# Patient Record
Sex: Male | Born: 1985 | Race: Black or African American | Hispanic: No | Marital: Single | Smoking: Current every day smoker
Health system: Southern US, Community
[De-identification: ages and names within clinical notes are randomized; demographics above are authoritative.]

---
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
## 2008-03-11 ENCOUNTER — Emergency Department (HOSPITAL_COMMUNITY): Admission: EM | Admit: 2008-03-11 | Discharge: 2008-03-12 | Payer: Self-pay | Admitting: Emergency Medicine

## 2009-06-06 IMAGING — CT CT ABDOMEN W/O CM
2 of 4 series · 13 of 32 positions shown, 19 images · non-contrast
Comparison: None.

CT ABDOMEN

CLINICAL DATA: 21-year-old male with gross hematuria and abdominal
pain.

CT ABDOMEN AND PELVIS WITHOUT CONTRAST
TECHNIQUE: Multidetector CT imaging of the abdomen and pelvis was
performed following the standard
protocol without intravenous contrast.

[Series 2: <(id) w/o a/p >(id) · axial · non-contrast · 0.59mm/px · z∈[-398,-63]mm · 11 of 81 slices shown, 17 images]
[im 7/81  soft-tissue]
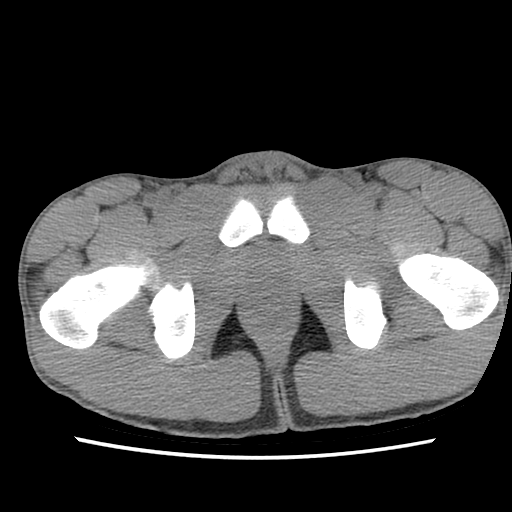
[im 7/81  bone]
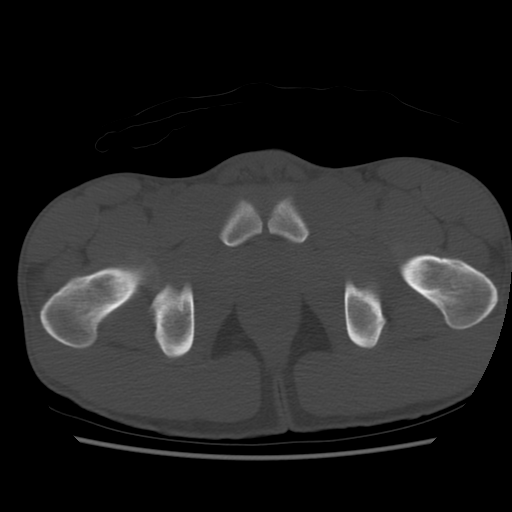
[im 14/81  soft-tissue]
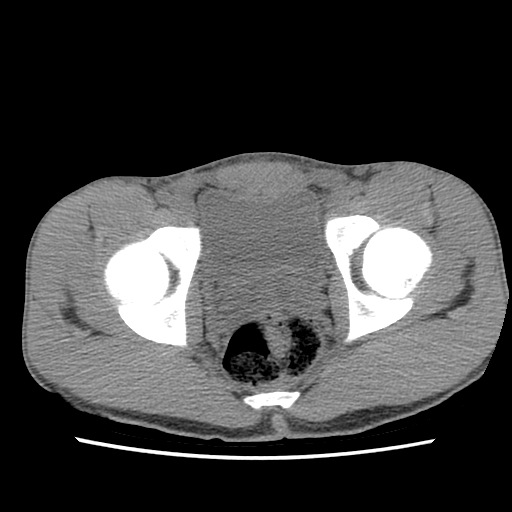
[im 21/81  soft-tissue]
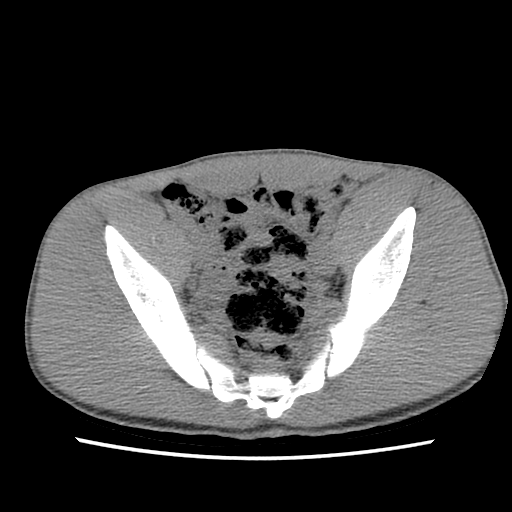
[im 27/81  soft-tissue]
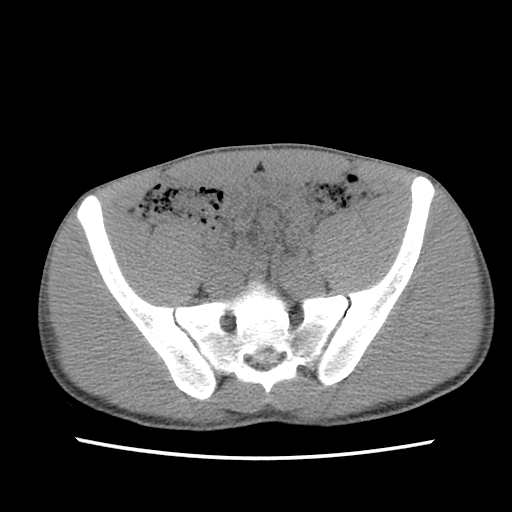
[im 34/81  soft-tissue]
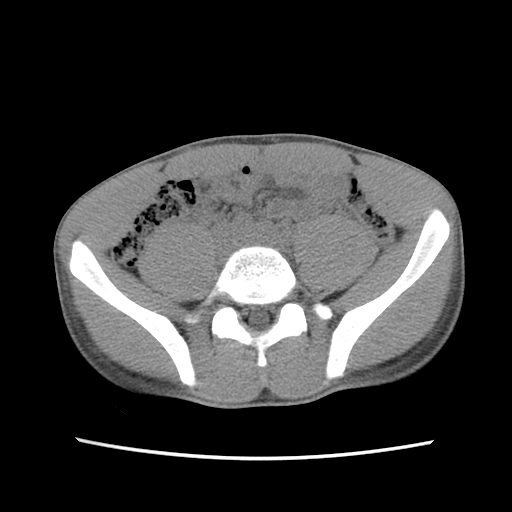
[im 41/81  soft-tissue]
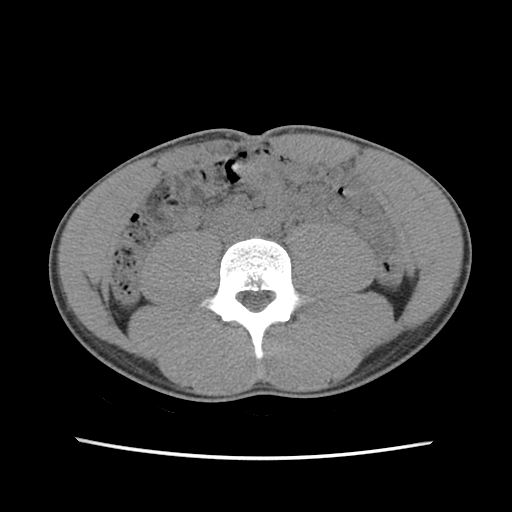
[im 47/81  soft-tissue]
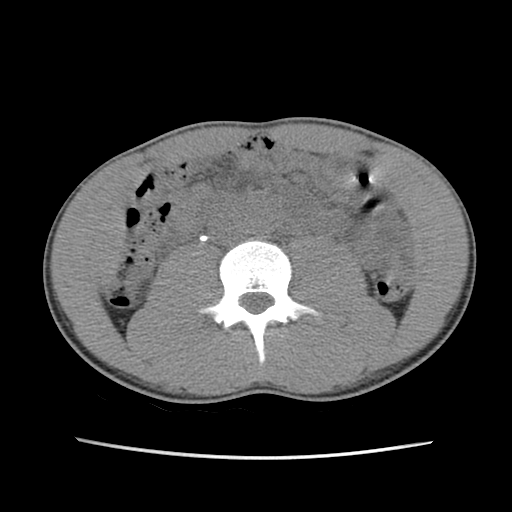
[im 54/81  soft-tissue]
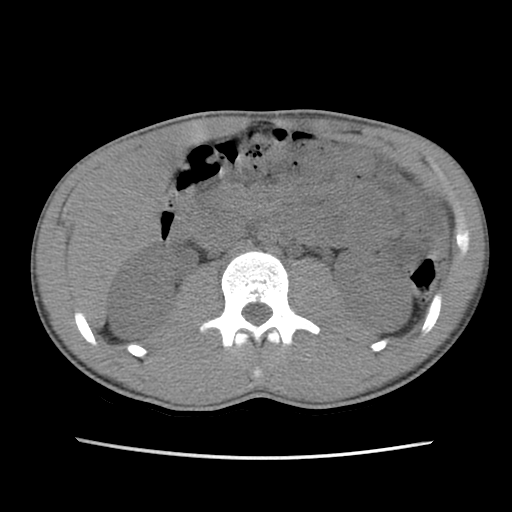
[im 54/81  lung]
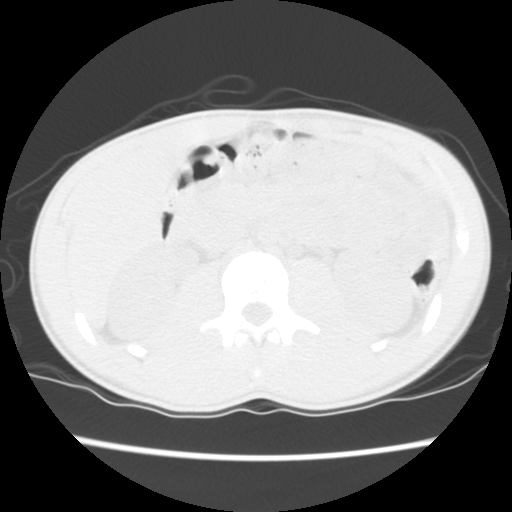
[im 61/81  soft-tissue]
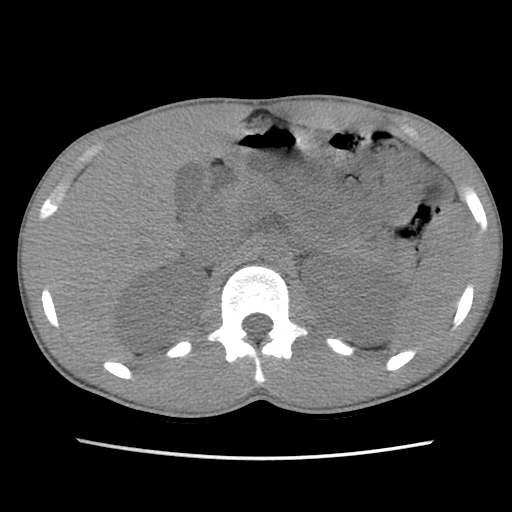
[im 61/81  lung]
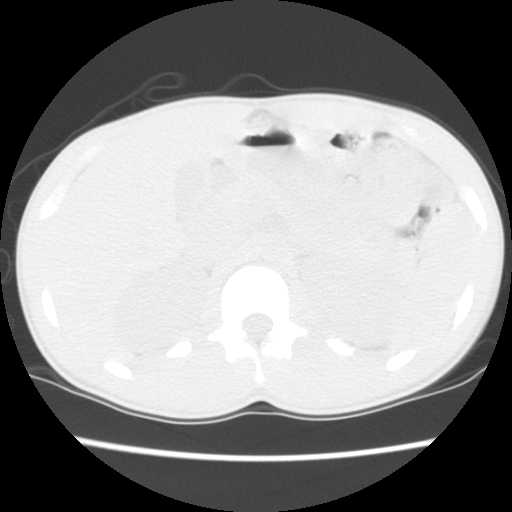
[im 61/81  bone]
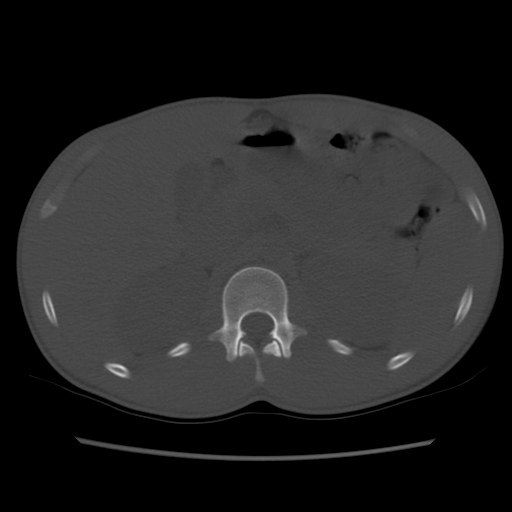
[im 67/81  soft-tissue]
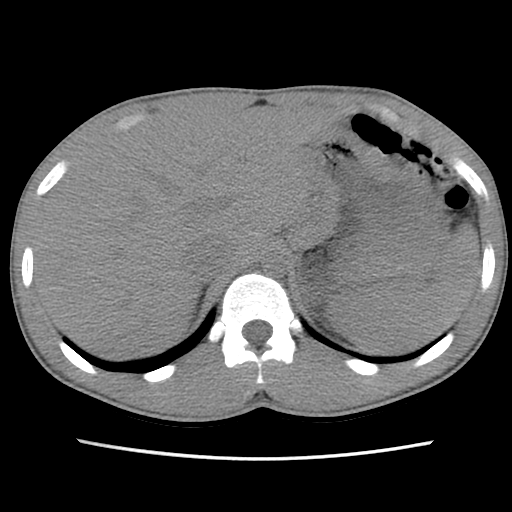
[im 67/81  lung]
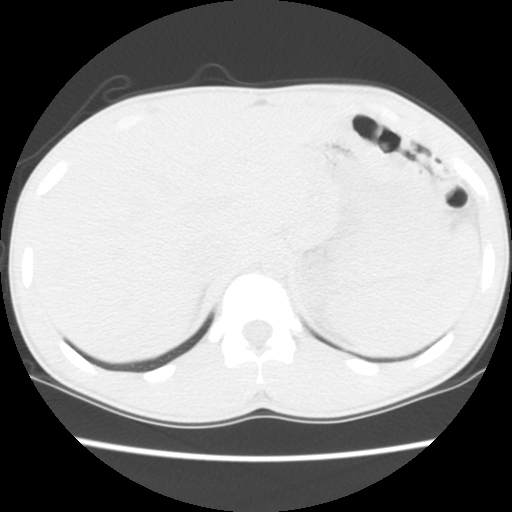
[im 74/81  soft-tissue]
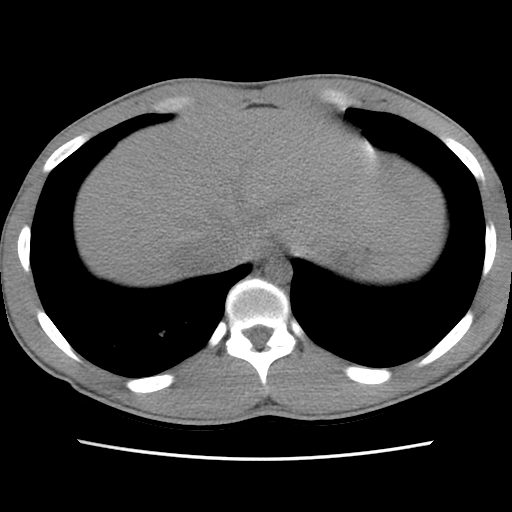
[im 74/81  lung]
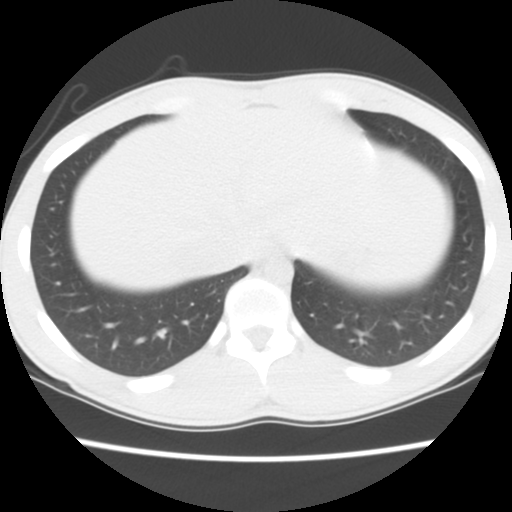

[Series 3: recon 2: <(id) w/o a/p >(id) · axial · non-contrast · 0.59mm/px · z∈[-118,-73]mm · 2 of 27 slices shown]
[im 9/27  soft-tissue]
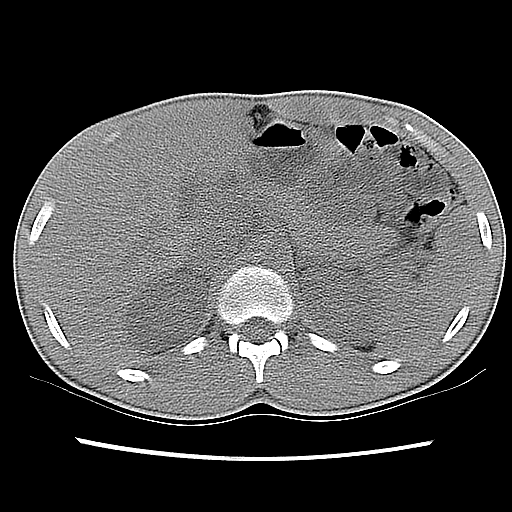
[im 18/27  soft-tissue]
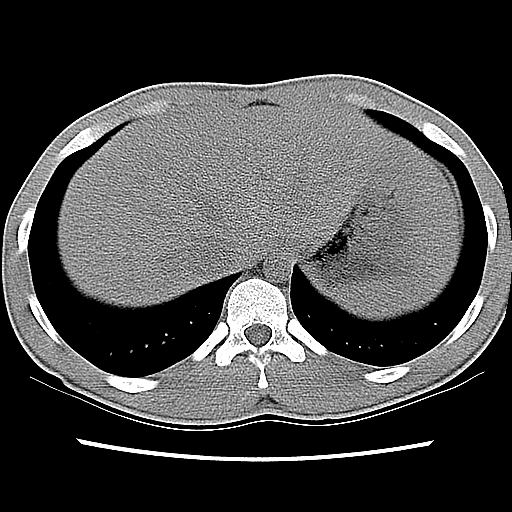

[13 of 32 positions shown; findings below may reference images not displayed]

FINDINGS: Visualized lung bases are clear.  The patient is then,
there is a paucity of intra-abdominal fat.  No osseous abnormality.
Visualized noncontrasted liver, gallbladder, spleen, pancreas,
adrenal glands, stomach and proximal small bowel loops are grossly
normal. Colon appears relatively decompressed with scant retained
stool.  Appendix is not delineated.  No free fluid.

Subtle right hydronephrosis.  Proximal right hydroureter.  There is
a proximal obstructing right ureteral calculus measuring 4.1 x
mm.  This calculus is visible on the CT scout view at the level of
the L3 inferior endplate. The left kidney and visualized proximal
left ureter are normal.
IMPRESSION: 1.  Proximal right ureteral calculus measures 4.1 x 5.6 mm and is
visible on the CT scout view at the inferior L3 level.  There is
subsequent obstructive uropathy on the right.
2.  Paucity of intra-abdominal fat results in suboptimal visceral
detail in the absence of contrast.

CT PELVIS
FINDINGS: No free fluid.  Gas and stool in the distal colon which
is otherwise normal.  The bladder is within normal limits.  There
is a small 1.7 mm calcific density in the central pelvis above the
expected level of the ureterovesicle junction and more medial than
the expected course of the distal ureters.  This may represent
enteric contents.  No osseous abnormality.
IMPRESSION: Small 1.7 mm calcific density in the right central pelvis is felt
unlikely to be a small distal ureteral calculus.

## 2010-04-03 ENCOUNTER — Emergency Department (HOSPITAL_COMMUNITY): Admission: EM | Admit: 2010-04-03 | Discharge: 2010-04-03 | Payer: Self-pay | Admitting: Emergency Medicine

## 2011-02-02 ENCOUNTER — Emergency Department (HOSPITAL_COMMUNITY)
Admission: EM | Admit: 2011-02-02 | Discharge: 2011-02-02 | Disposition: A | Discharge status: Home / Self Care | Payer: Self-pay | Attending: Emergency Medicine | Admitting: Emergency Medicine

## 2011-02-02 DIAGNOSIS — L298 Other pruritus: Secondary | ICD-10-CM | POA: Insufficient documentation

## 2011-02-02 DIAGNOSIS — R21 Rash and other nonspecific skin eruption: Secondary | ICD-10-CM | POA: Insufficient documentation

## 2011-02-02 DIAGNOSIS — L259 Unspecified contact dermatitis, unspecified cause: Secondary | ICD-10-CM | POA: Insufficient documentation

## 2011-02-02 DIAGNOSIS — L2989 Other pruritus: Secondary | ICD-10-CM | POA: Insufficient documentation

## 2011-02-17 ENCOUNTER — Emergency Department (HOSPITAL_COMMUNITY)
Admission: EM | Admit: 2011-02-17 | Discharge: 2011-02-17 | Disposition: A | Discharge status: Home / Self Care | Payer: Self-pay | Attending: Emergency Medicine | Admitting: Emergency Medicine

## 2011-02-17 DIAGNOSIS — L298 Other pruritus: Secondary | ICD-10-CM | POA: Insufficient documentation

## 2011-02-17 DIAGNOSIS — R21 Rash and other nonspecific skin eruption: Secondary | ICD-10-CM | POA: Insufficient documentation

## 2011-02-17 DIAGNOSIS — B86 Scabies: Secondary | ICD-10-CM | POA: Insufficient documentation

## 2011-02-17 DIAGNOSIS — L2989 Other pruritus: Secondary | ICD-10-CM | POA: Insufficient documentation

## 2011-08-24 LAB — COMPREHENSIVE METABOLIC PANEL
ALT: 13
AST: 22
Albumin: 3.9
Alkaline Phosphatase: 125 — ABNORMAL HIGH
BUN: 10
CO2: 26
Calcium: 8.7
Chloride: 104
Creatinine, Ser: 0.98
GFR calc Af Amer: >60
GFR calc non Af Amer: >60
Glucose, Bld: 127 — ABNORMAL HIGH
Potassium: 3.9
Sodium: 140
Total Bilirubin: 0.8
Total Protein: 6.6

## 2011-08-24 LAB — URINE CULTURE
Colony Count: NO GROWTH
Culture: NO GROWTH

## 2011-08-24 LAB — CBC
HCT: 43.4
Hemoglobin: 14.6
MCHC: 33.5
MCV: 91.4
Platelets: 216
RBC: 4.75
RDW: 13.3
WBC: 7.9

## 2011-08-24 LAB — DIFFERENTIAL
Basophils Absolute: 0
Basophils Relative: 0
Eosinophils Absolute: 0
Eosinophils Relative: 0
Lymphocytes Relative: 10 — ABNORMAL LOW
Lymphs Abs: 0.8
Monocytes Absolute: 0.3
Monocytes Relative: 4
Neutro Abs: 6.8
Neutrophils Relative %: 86 — ABNORMAL HIGH

## 2011-08-24 LAB — URINE MICROSCOPIC-ADD ON

## 2011-08-24 LAB — URINALYSIS, ROUTINE W REFLEX MICROSCOPIC
Glucose, UA: NEGATIVE
Ketones, ur: NEGATIVE
Protein, ur: >300 — AB
Urobilinogen, UA: 2 — ABNORMAL HIGH

## 2012-10-12 ENCOUNTER — Encounter (HOSPITAL_COMMUNITY): Payer: Self-pay | Admitting: Emergency Medicine

## 2012-10-12 ENCOUNTER — Emergency Department (HOSPITAL_COMMUNITY)
Admission: EM | Admit: 2012-10-12 | Discharge: 2012-10-12 | Disposition: A | Discharge status: Home / Self Care | Payer: Self-pay | Attending: Emergency Medicine | Admitting: Emergency Medicine

## 2012-10-12 DIAGNOSIS — L259 Unspecified contact dermatitis, unspecified cause: Secondary | ICD-10-CM | POA: Insufficient documentation

## 2012-10-12 DIAGNOSIS — L309 Dermatitis, unspecified: Secondary | ICD-10-CM

## 2012-10-12 DIAGNOSIS — F17200 Nicotine dependence, unspecified, uncomplicated: Secondary | ICD-10-CM | POA: Insufficient documentation

## 2012-10-12 MED ORDER — MICONAZOLE NITRATE 2 % EX CREA: TOPICAL_CREAM | Freq: Two times a day (BID) | CUTANEOUS | Status: AC

## 2012-10-12 MED ORDER — HYDROXYZINE HCL 25 MG PO TABS: 25.0000 mg | ORAL_TABLET | Freq: Four times a day (QID) | ORAL | Status: AC

## 2012-10-12 MED ORDER — NEUTROGENA MOISTURE SENS SKIN EX LOTN: 1.0000 | TOPICAL_LOTION | Freq: Two times a day (BID) | CUTANEOUS | Status: AC

## 2012-10-12 NOTE — ED Notes (Signed)
Pt c/o rash and itching all over but predominantly over groin area, denies pain, states only itches, states rash over penis will scab and when gets erection, will crack and bleed, has been taking benadryl without result

## 2012-10-12 NOTE — ED Provider Notes (Signed)
History  Scribed for Gerhard Munch, MD, the patient was seen in room TR04C/TR04C. This chart was scribed by Candelaria Stagers. The patient's care started at 12:52 PM   CSN: [id number]  Arrival date & time 10/12/12  1202   First MD Initiated Contact with Patient 10/12/12 1235      Chief Complaint  Patient presents with  . Rash     The history is provided by the patient. No language interpreter was used.   Cody Ewing is a 26 y.o. male who presents to the Emergency Department complaining of an itching rash that started about a week ago to the thighs, groin, arms, and chest.  He reports that he first noticed the rash around his waist and later spread.  Pt reports that his skin feels tight in particularly to his groin with skin cracking and bleeding.  He has h/o scabies and reports that this feels different.  He has taken benadryl with mild relief.       History reviewed. No pertinent past medical history.  History reviewed. No pertinent past surgical history.  No family history on file.  History  Substance Use Topics  . Smoking status: Current Every Day Smoker -- 0.5 packs/day    Types: Cigarettes  . Smokeless tobacco: Not on file  . Alcohol Use: No      Review of Systems  Constitutional: Negative for fever.       Per HPI, otherwise negative  HENT:       Per HPI, otherwise negative  Eyes: Negative.   Respiratory: Negative for shortness of breath.        Per HPI, otherwise negative  Cardiovascular:       Per HPI, otherwise negative  Gastrointestinal: Negative for vomiting, abdominal pain and diarrhea.  Genitourinary: Negative.   Musculoskeletal:       Per HPI, otherwise negative  Skin: Positive for rash.  Neurological: Negative for syncope.  All other systems reviewed and are negative.    Allergies  Review of patient's allergies indicates no known allergies.  Home Medications  No current outpatient prescriptions on file.  BP 120/71  Pulse 83  Temp 97.9  F (36.6 C) (Oral)  Resp 16  SpO2 97%  Physical Exam  Nursing note and vitals reviewed. Constitutional: He is oriented to person, place, and time. He appears well-developed and well-nourished. No distress.  HENT:  Head: Normocephalic and atraumatic.  Eyes: EOM are normal.  Neck: Neck supple. No tracheal deviation present.  Musculoskeletal: Normal range of motion.  Neurological: He is alert and oriented to person, place, and time.  Skin: Skin is warm and dry.       Diffuse non confluent minimally raised papules mostly around mid section.  Widely spaced lesions on extremities.  Most are scabbed over.  Genital area has super dry and cracked skin.    Psychiatric: He has a normal mood and affect. His behavior is normal.    ED Course  Procedures   DIAGNOSTIC STUDIES: Oxygen Saturation is 97% on room air, normal by my interpretation.    COORDINATION OF CARE:  12:58 PM Will prescribed cream for rash.  Discussed sx that should bring him back to the ED.  Pt understands and agrees.     Labs Reviewed - No data to display No results found.   No diagnosis found.    MDM  I personally performed the services described in this documentation, which was scribed in my presence. The recorded information has been reviewed and  is accurate.  This generally well young male presents with one week of skin lesions.  On exam he is in no distress, is afebrile, with unremarkable vital signs.  The patient's description of lesions or generally resolving is suggestive of dermatitis.  We discussed the need for appropriate lotion use, return precautions.  Absent ongoing new lesion development, and with no unremarkable vital sign changes, there is low suspicion for systemic illness or any indication for antibiotics. That would, however the patient's genitals are more affected him a possibly with a candidal dermatitis.  He was prescribed topical antifungal.  I discussed return precautions, appropriate medication  use with the patient, and he was discharged in stable condition.  Gerhard Munch, MD 10/12/12 [phone]

## 2012-10-12 NOTE — ED Notes (Signed)
Pt here with c/o rash all over, mostly on the abdominal and groin area.

## 2018-01-04 ENCOUNTER — Emergency Department (HOSPITAL_COMMUNITY)
Admission: EM | Admit: 2018-01-04 | Discharge: 2018-01-04 | Disposition: A | Discharge status: Home / Self Care | Payer: Self-pay | Attending: Emergency Medicine | Admitting: Emergency Medicine

## 2018-01-04 ENCOUNTER — Encounter (HOSPITAL_COMMUNITY): Payer: Self-pay

## 2018-01-04 DIAGNOSIS — H9203 Otalgia, bilateral: Secondary | ICD-10-CM | POA: Insufficient documentation

## 2018-01-04 DIAGNOSIS — Z5321 Procedure and treatment not carried out due to patient leaving prior to being seen by health care provider: Secondary | ICD-10-CM | POA: Insufficient documentation

## 2018-01-04 NOTE — ED Notes (Signed)
Pt does not answer when called x3 

## 2018-01-04 NOTE — ED Triage Notes (Signed)
Patient complains of a few days of bilateral ear discomfort and fullness, no other associated symptoms

## 2018-01-05 ENCOUNTER — Encounter (HOSPITAL_COMMUNITY): Payer: Self-pay | Admitting: Emergency Medicine

## 2018-01-05 ENCOUNTER — Other Ambulatory Visit: Payer: Self-pay

## 2018-01-05 DIAGNOSIS — Z5321 Procedure and treatment not carried out due to patient leaving prior to being seen by health care provider: Secondary | ICD-10-CM | POA: Insufficient documentation

## 2018-01-05 DIAGNOSIS — H9203 Otalgia, bilateral: Secondary | ICD-10-CM | POA: Insufficient documentation

## 2018-01-05 NOTE — ED Triage Notes (Signed)
Pt c/o bilateral ear pain, worse on the right.

## 2018-01-06 ENCOUNTER — Ambulatory Visit (HOSPITAL_COMMUNITY): Admission: EM | Admit: 2018-01-06 | Discharge: 2018-01-06 | Discharge status: Against Medical Advice | Payer: Self-pay

## 2018-01-06 ENCOUNTER — Emergency Department (HOSPITAL_COMMUNITY)
Admission: EM | Admit: 2018-01-06 | Discharge: 2018-01-06 | Disposition: A | Discharge status: Home / Self Care | Payer: Self-pay | Attending: Emergency Medicine | Admitting: Emergency Medicine

## 2018-01-06 NOTE — ED Notes (Signed)
No answer for treatment room in lobby, pt presumed to have left without being seen.  

## 2018-01-06 NOTE — ED Notes (Signed)
Patient called x 1 from the lobby with no answer.

## 2019-01-23 ENCOUNTER — Emergency Department (HOSPITAL_COMMUNITY)
Admission: EM | Admit: 2019-01-23 | Discharge: 2019-01-23 | Disposition: A | Discharge status: Home / Self Care | Payer: Self-pay | Attending: Emergency Medicine | Admitting: Emergency Medicine

## 2019-01-23 ENCOUNTER — Other Ambulatory Visit: Payer: Self-pay

## 2019-01-23 ENCOUNTER — Encounter (HOSPITAL_COMMUNITY): Payer: Self-pay | Admitting: *Deleted

## 2019-01-23 DIAGNOSIS — R69 Illness, unspecified: Secondary | ICD-10-CM

## 2019-01-23 DIAGNOSIS — F17210 Nicotine dependence, cigarettes, uncomplicated: Secondary | ICD-10-CM | POA: Insufficient documentation

## 2019-01-23 DIAGNOSIS — Z79899 Other long term (current) drug therapy: Secondary | ICD-10-CM | POA: Insufficient documentation

## 2019-01-23 DIAGNOSIS — J111 Influenza due to unidentified influenza virus with other respiratory manifestations: Secondary | ICD-10-CM | POA: Insufficient documentation

## 2019-01-23 MED ORDER — IBUPROFEN 400 MG PO TABS
400.0000 mg | ORAL_TABLET | Freq: Once | ORAL | Status: AC
  Administered 2019-01-23: 400 mg via ORAL
  Filled 2019-01-23: qty 1

## 2019-01-23 MED ORDER — OSELTAMIVIR PHOSPHATE 75 MG PO CAPS: 75.0000 mg | ORAL_CAPSULE | Freq: Two times a day (BID) | ORAL | 0 refills | Status: AC

## 2019-01-23 MED ORDER — ACETAMINOPHEN 325 MG PO TABS
650.0000 mg | ORAL_TABLET | Freq: Once | ORAL | Status: AC
  Administered 2019-01-23: 650 mg via ORAL
  Filled 2019-01-23: qty 2

## 2019-01-23 NOTE — ED Notes (Signed)
Declined W/C at D/C and was escorted to lobby by RN. 

## 2019-01-23 NOTE — ED Triage Notes (Signed)
Pt reports flu Sx's ,general muscle pains , sore throat, HA , nausea.

## 2019-01-23 NOTE — ED Provider Notes (Signed)
[facility] EMERGENCY DEPARTMENT Provider Note   CSN: [id number] Arrival date & time: 01/23/19  1229    History   Chief Complaint Chief Complaint  Patient presents with  . Influenza    HPI Kemauri Duty is a 33 y.o. male.     HPI Patient with acute onset fever, shaking chills, diffuse myalgias, sore throat, nasal congestion, cough.  Patient states he is also had some nausea but no vomiting.  Denies chest pain or abdominal pain.  Did not get a flu shot this year.  No known sick contacts.  No new rashes.  No insect or tick bites.  History reviewed. No pertinent past medical history.  There are no active problems to display for this patient.   History reviewed. No pertinent surgical history.      Home Medications    Prior to Admission medications   Medication Sig Start Date End Date Taking? Authorizing Provider  Emollient (NEUTROGENA MOISTURE SENS SKIN) LOTN Apply 1 Bottle topically 2 (two) times daily. 10/12/12   Gerhard Munch, MD  hydrOXYzine (ATARAX/VISTARIL) 25 MG tablet Take 1 tablet (25 mg total) by mouth every 6 (six) hours. 10/12/12   Gerhard Munch, MD  oseltamivir (TAMIFLU) 75 MG capsule Take 1 capsule (75 mg total) by mouth every 12 (twelve) hours. 01/23/19   Loren Racer, MD    Family History History reviewed. No pertinent family history.  Social History Social History   Tobacco Use  . Smoking status: Current Every Day Smoker    Packs/day: 0.50    Types: Cigarettes  . Smokeless tobacco: Never Used  Substance Use Topics  . Alcohol use: No  . Drug use: No     Allergies   Patient has no known allergies.   Review of Systems Review of Systems  Constitutional: Positive for chills, fatigue and fever.  HENT: Positive for congestion, rhinorrhea and sore throat. Negative for trouble swallowing.   Eyes: Negative for photophobia and visual disturbance.  Respiratory: Positive for cough. Negative for shortness of breath.     Cardiovascular: Negative for chest pain.  Gastrointestinal: Positive for nausea. Negative for abdominal pain, constipation, diarrhea and vomiting.  Genitourinary: Negative for dysuria, flank pain and frequency.  Musculoskeletal: Positive for back pain and myalgias. Negative for neck pain and neck stiffness.  Skin: Negative for rash and wound.  Neurological: Positive for headaches. Negative for dizziness, weakness, light-headedness and numbness.  All other systems reviewed and are negative.    Physical Exam Updated Vital Signs BP 120/78 (BP Location: Right Arm)   Pulse (!) 101   Temp (!) 102.1 F (38.9 C) (Oral)   Resp 20   Ht 5\' 6"  (1.676 m)   Wt 56.7 kg   SpO2 97%   BMI 20.18 kg/m   Physical Exam Vitals signs and nursing note reviewed.  Constitutional:      Appearance: Normal appearance. He is well-developed.  HENT:     Head: Normocephalic and atraumatic.     Nose: Congestion present.     Mouth/Throat:     Mouth: Mucous membranes are moist.     Pharynx: No oropharyngeal exudate or posterior oropharyngeal erythema.  Eyes:     Extraocular Movements: Extraocular movements intact.     Pupils: Pupils are equal, round, and reactive to light.  Neck:     Musculoskeletal: Normal range of motion and neck supple. No neck rigidity or muscular tenderness.     Comments: No meningismus Cardiovascular:     Rate and Rhythm: Normal  rate and regular rhythm.     Heart sounds: No murmur. No friction rub. No gallop.   Pulmonary:     Effort: Pulmonary effort is normal. No respiratory distress.     Breath sounds: Normal breath sounds. No stridor. No wheezing, rhonchi or rales.  Chest:     Chest wall: No tenderness.  Abdominal:     General: Bowel sounds are normal.     Palpations: Abdomen is soft.     Tenderness: There is no abdominal tenderness. There is no guarding or rebound.  Musculoskeletal: Normal range of motion.        General: No swelling, tenderness, deformity or signs of  injury.     Right lower leg: No edema.     Left lower leg: No edema.     Comments: No CVA tenderness.  No midline thoracic or lumbar tenderness.  Patient does have some mild diffuse bilateral paraspinal muscular tenderness.  No lower extremity swelling, asymmetry or tenderness.  Lymphadenopathy:     Cervical: No cervical adenopathy.  Skin:    General: Skin is warm and dry.     Capillary Refill: Capillary refill takes less than 2 seconds.     Findings: No erythema or rash.  Neurological:     General: No focal deficit present.     Mental Status: He is alert and oriented to person, place, and time.     Comments: 5/5 motor in all extremities.  Sensation fully intact.  Psychiatric:        Mood and Affect: Mood normal.        Behavior: Behavior normal.      ED Treatments / Results  Labs (all labs ordered are listed, but only abnormal results are displayed) Labs Reviewed - No data to display  EKG None  Radiology No results found.  Procedures Procedures (including critical care time)  Medications Ordered in ED Medications  ibuprofen (ADVIL,MOTRIN) tablet 400 mg (400 mg Oral Given 01/23/19 1302)  acetaminophen (TYLENOL) tablet 650 mg (650 mg Oral Given 01/23/19 1302)     Initial Impression / Assessment and Plan / ED Course  I have reviewed the triage vital signs and the nursing notes.  Pertinent labs & imaging results that were available during my care of the patient were reviewed by me and considered in my medical decision making (see chart for details).        Patient is well-appearing.  Symptoms began roughly 24 hours ago.  Clinically, patient appears to have influenza-like illness.  Patient is requesting prescription for Tamiflu.  Advised Tylenol and ibuprofen as needed for body aches and fever.  Strict return precautions have been given.  Final Clinical Impressions(s) / ED Diagnoses   Final diagnoses:  Influenza-like illness    ED Discharge Orders         Ordered     oseltamivir (TAMIFLU) 75 MG capsule  Every 12 hours     01/23/19 1337           Loren Racer, MD 01/23/19 1337

## 2021-05-25 ENCOUNTER — Encounter (HOSPITAL_COMMUNITY): Payer: Self-pay | Admitting: Emergency Medicine

## 2021-05-25 ENCOUNTER — Other Ambulatory Visit: Payer: Self-pay

## 2021-05-25 ENCOUNTER — Emergency Department (HOSPITAL_COMMUNITY)
Admission: EM | Admit: 2021-05-25 | Discharge: 2021-05-25 | Disposition: A | Discharge status: Home / Self Care | Payer: 59 | Attending: Physician Assistant | Admitting: Physician Assistant

## 2021-05-25 DIAGNOSIS — K625 Hemorrhage of anus and rectum: Secondary | ICD-10-CM | POA: Diagnosis not present

## 2021-05-25 DIAGNOSIS — R109 Unspecified abdominal pain: Secondary | ICD-10-CM | POA: Insufficient documentation

## 2021-05-25 DIAGNOSIS — Z5321 Procedure and treatment not carried out due to patient leaving prior to being seen by health care provider: Secondary | ICD-10-CM | POA: Insufficient documentation

## 2021-05-25 LAB — COMPREHENSIVE METABOLIC PANEL
ALT: 19 U/L (ref 0–44)
AST: 20 U/L (ref 15–41)
Albumin: 4.2 g/dL (ref 3.5–5.0)
Alkaline Phosphatase: 70 U/L (ref 38–126)
Anion gap: 8 (ref 5–15)
BUN: 10 mg/dL (ref 6–20)
CO2: 29 mmol/L (ref 22–32)
Calcium: 9.2 mg/dL (ref 8.9–10.3)
Chloride: 103 mmol/L (ref 98–111)
Creatinine, Ser: 0.99 mg/dL (ref 0.61–1.24)
GFR, Estimated: >60 mL/min (ref >60)
Glucose, Bld: 85 mg/dL (ref 70–99)
Potassium: 4.1 mmol/L (ref 3.5–5.1)
Sodium: 140 mmol/L (ref 135–145)
Total Bilirubin: 0.7 mg/dL (ref 0.3–1.2)
Total Protein: 6.9 g/dL (ref 6.5–8.1)

## 2021-05-25 LAB — CBC WITH DIFFERENTIAL/PLATELET
Abs Immature Granulocytes: 0.01 10*3/uL (ref 0.00–0.07)
Basophils Absolute: 0 10*3/uL (ref 0.0–0.1)
Basophils Relative: 1 %
Eosinophils Absolute: 0.2 10*3/uL (ref 0.0–0.5)
Eosinophils Relative: 3 %
HCT: 45.3 % (ref 39.0–52.0)
Hemoglobin: 14.9 g/dL (ref 13.0–17.0)
Immature Granulocytes: 0 %
Lymphocytes Relative: 44 %
Lymphs Abs: 2.4 10*3/uL (ref 0.7–4.0)
MCH: 31.4 pg (ref 26.0–34.0)
MCHC: 32.9 g/dL (ref 30.0–36.0)
MCV: 95.4 fL (ref 80.0–100.0)
Monocytes Absolute: 0.4 10*3/uL (ref 0.1–1.0)
Monocytes Relative: 7 %
Neutro Abs: 2.5 10*3/uL (ref 1.7–7.7)
Neutrophils Relative %: 45 %
Platelets: 231 10*3/uL (ref 150–400)
RBC: 4.75 MIL/uL (ref 4.22–5.81)
RDW: 13 % (ref 11.5–15.5)
WBC: 5.6 10*3/uL (ref 4.0–10.5)
nRBC: 0 % (ref 0.0–0.2)

## 2021-05-25 LAB — URINALYSIS, ROUTINE W REFLEX MICROSCOPIC
Bilirubin Urine: NEGATIVE
Glucose, UA: NEGATIVE mg/dL
Hgb urine dipstick: NEGATIVE
Ketones, ur: NEGATIVE mg/dL
Leukocytes,Ua: NEGATIVE
Nitrite: NEGATIVE
Protein, ur: NEGATIVE mg/dL
Specific Gravity, Urine: 1.019 (ref 1.005–1.030)
pH: 7 (ref 5.0–8.0)

## 2021-05-25 LAB — LIPASE, BLOOD: Lipase: 26 U/L (ref 11–51)

## 2021-05-25 NOTE — ED Notes (Signed)
Patient called for room assignment with no response 

## 2021-05-25 NOTE — ED Triage Notes (Signed)
Pt c/o 2 episodes of blood in stool since Saturday. C/o mild abdominal discomfort. Denies nausea/vomiting/diarrhea, dizziness, chest pain, shortness of breath.

## 2021-05-25 NOTE — ED Notes (Signed)
Patient called for vitals recheck x1 with no response 

## 2021-05-25 NOTE — ED Provider Notes (Signed)
Emergency Medicine Provider Triage Evaluation Note  Cody Ewing , a 35 y.o. male  was evaluated in triage.  Pt complains of rectal bleeding that started 2 days ago. He has had two episodes of bleeding. He has minimal abd pain.  Review of Systems  Positive: Rectal bleeding, min abd pain Negative: fever  Physical Exam  There were no vitals taken for this visit. Gen:   Awake, no distress   Resp:  Normal effort  MSK:   Moves extremities without difficulty   Medical Decision Making  Medically screening exam initiated at 6:58 PM.  Appropriate orders placed.  Kailin Leu was informed that the remainder of the evaluation will be completed by another provider, this initial triage assessment does not replace that evaluation, and the importance of remaining in the ED until their evaluation is complete.     Karrie Meres, PA-C 05/25/21 1859    Terrilee Files, MD 05/26/21 1022

## 2023-02-13 ENCOUNTER — Other Ambulatory Visit: Payer: Self-pay

## 2023-02-13 ENCOUNTER — Emergency Department (HOSPITAL_COMMUNITY)
Admission: EM | Admit: 2023-02-13 | Discharge: 2023-02-13 | Disposition: A | Discharge status: Home / Self Care | Payer: Self-pay | Attending: Emergency Medicine | Admitting: Emergency Medicine

## 2023-02-13 ENCOUNTER — Emergency Department (HOSPITAL_COMMUNITY): Payer: Self-pay

## 2023-02-13 ENCOUNTER — Encounter (HOSPITAL_COMMUNITY): Payer: Self-pay | Admitting: Emergency Medicine

## 2023-02-13 DIAGNOSIS — R0789 Other chest pain: Secondary | ICD-10-CM | POA: Insufficient documentation

## 2023-02-13 DIAGNOSIS — Z1152 Encounter for screening for COVID-19: Secondary | ICD-10-CM | POA: Insufficient documentation

## 2023-02-13 LAB — CBC
HCT: 46.5 % (ref 39.0–52.0)
Hemoglobin: 15.3 g/dL (ref 13.0–17.0)
MCH: 31.2 pg (ref 26.0–34.0)
MCHC: 32.9 g/dL (ref 30.0–36.0)
MCV: 94.7 fL (ref 80.0–100.0)
Platelets: 209 10*3/uL (ref 150–400)
RBC: 4.91 MIL/uL (ref 4.22–5.81)
RDW: 13.3 % (ref 11.5–15.5)
WBC: 6.7 10*3/uL (ref 4.0–10.5)
nRBC: 0 % (ref 0.0–0.2)

## 2023-02-13 LAB — BASIC METABOLIC PANEL
Anion gap: 7 (ref 5–15)
BUN: 9 mg/dL (ref 6–20)
CO2: 28 mmol/L (ref 22–32)
Calcium: 8.6 mg/dL — ABNORMAL LOW (ref 8.9–10.3)
Chloride: 105 mmol/L (ref 98–111)
Creatinine, Ser: 1.04 mg/dL (ref 0.61–1.24)
GFR, Estimated: >60 mL/min (ref >60)
Glucose, Bld: 93 mg/dL (ref 70–99)
Potassium: 3.8 mmol/L (ref 3.5–5.1)
Sodium: 140 mmol/L (ref 135–145)

## 2023-02-13 LAB — TROPONIN I (HIGH SENSITIVITY)
Troponin I (High Sensitivity): 3 ng/L (ref <18)
Troponin I (High Sensitivity): 2 ng/L (ref <18)

## 2023-02-13 LAB — RESP PANEL BY RT-PCR (RSV, FLU A&B, COVID)  RVPGX2
Influenza A by PCR: NEGATIVE
Influenza B by PCR: NEGATIVE
Resp Syncytial Virus by PCR: NEGATIVE
SARS Coronavirus 2 by RT PCR: NEGATIVE

## 2023-02-13 LAB — D-DIMER, QUANTITATIVE: D-Dimer, Quant: <0.27 ug/mL-FEU (ref 0.00–0.50)

## 2023-02-13 MED ORDER — IBUPROFEN 800 MG PO TABS: 800.0000 mg | ORAL_TABLET | Freq: Three times a day (TID) | ORAL | 0 refills | Status: AC | PRN

## 2023-02-13 NOTE — Discharge Instructions (Signed)
Follow-up with family doctor if not improving

## 2023-02-13 NOTE — ED Provider Triage Note (Signed)
Emergency Medicine Provider Triage Evaluation Note  Cody Ewing , a 37 y.o. male  was evaluated in triage.  Pt complains of left-sided, sharp chest pain that began approximate 1 week ago.  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  Movement does seem to are aggravate the area.  He states that moving his left arm/shoulder and bending forward both seem to make the pain worse.  He states that deep breath also causes an increase in his pain.  Pain is currently 7 out of 10 in severity.  He states over the past 3 days it has been gradually increasing in severity.  He states he smokes 1 to 2 cigarettes daily.  He denies recent surgery, travel, cancer history.  Patient denies shortness of breath, nausea, vomiting  Review of Systems  Positive: As above Negative: As above of  Physical Exam  There were no vitals taken for this visit. Gen:   Awake, no distress   Resp:  Normal effort  MSK:   Moves extremities without difficulty  Other:    Medical Decision Making  Medically screening exam initiated at 3:58 PM.  Appropriate orders placed.  Alin Maisto was informed that the remainder of the evaluation will be completed by another provider, this initial triage assessment does not replace that evaluation, and the importance of remaining in the ED until their evaluation is complete.     Dorothyann Peng, PA-C 02/13/23 [phone]

## 2023-02-13 NOTE — ED Provider Notes (Signed)
[place] EMERGENCY DEPARTMENT AT [facility] Provider Note   CSN: [id number] Arrival date & time: 02/13/23  1554     History {Add pertinent medical, surgical, social history, OB history to HPI:1} Chief Complaint  Patient presents with   Chest Pain    Cody Ewing is a 37 y.o. male.  Patient complains of left-sided chest pain.  Patient does not have any medical problems.   Chest Pain      Home Medications Prior to Admission medications   Medication Sig Start Date End Date Taking? Authorizing Provider  ibuprofen (ADVIL) 800 MG tablet Take 1 tablet (800 mg total) by mouth every 8 (eight) hours as needed. 02/13/23  Yes Milton Ferguson, MD  Emollient (NEUTROGENA MOISTURE SENS SKIN) LOTN Apply 1 Bottle topically 2 (two) times daily. 10/12/12   Carmin Muskrat, MD  hydrOXYzine (ATARAX/VISTARIL) 25 MG tablet Take 1 tablet (25 mg total) by mouth every 6 (six) hours. 10/12/12   Carmin Muskrat, MD  oseltamivir (TAMIFLU) 75 MG capsule Take 1 capsule (75 mg total) by mouth every 12 (twelve) hours. 01/23/19   Julianne Rice, MD      Allergies    Patient has no known allergies.    Review of Systems   Review of Systems  Cardiovascular:  Positive for chest pain.    Physical Exam Updated Vital Signs BP 131/87 (BP Location: Right Arm)   Pulse 75   Temp 98.1 F (36.7 C) (Oral)   Resp 13   SpO2 98%  Physical Exam  ED Results / Procedures / Treatments   Labs (all labs ordered are listed, but only abnormal results are displayed) Labs Reviewed  BASIC METABOLIC PANEL - Abnormal; Notable for the following components:      Result Value   Calcium 8.6 (*)    All other components within normal limits  CBC  D-DIMER, QUANTITATIVE  TROPONIN I (HIGH SENSITIVITY)  TROPONIN I (HIGH SENSITIVITY)    EKG EKG Interpretation  Date/Time:  Sunday February 13 2023 16:02:51 EDT Ventricular Rate:  84 PR Interval:  138 QRS Duration: 85 QT Interval:  358 QTC  Calculation: 424 R Axis:   61 Text Interpretation: Sinus rhythm Confirmed by Milton Ferguson [id number] on 02/13/2023 6:28:15 PM  Radiology DG Chest 2 View  Result Date: 02/13/2023 CLINICAL DATA:  Left-sided chest pain 1 week. EXAM: CHEST - 2 VIEW COMPARISON:  None Available. FINDINGS: Lungs are adequately inflated without focal airspace consolidation or effusion. Cardiomediastinal silhouette is normal. Bones and soft tissues are normal. IMPRESSION: No active cardiopulmonary disease. Electronically Signed   By: Marin Olp M.D.   On: 02/13/2023 16:49    Procedures Procedures  {Document cardiac monitor, telemetry assessment procedure when appropriate:1}  Medications Ordered in ED Medications - No data to display  ED Course/ Medical Decision Making/ A&P   {   Click here for ABCD2, HEART and other calculatorsREFRESH Note before signing :1}                          Medical Decision Making Risk Prescription drug management.   Patient with chest wall pain.  He is given Motrin will follow-up as needed  {Document critical care time when appropriate:1} {Document review of labs and clinical decision tools ie heart score, Chads2Vasc2 etc:1}  {Document your independent review of radiology images, and any outside records:1} {Document your discussion with family members, caretakers, and with consultants:1} {Document social determinants of health affecting pt's care:1} {Document your decision  making why or why not admission, treatments were needed:1} Final Clinical Impression(s) / ED Diagnoses Final diagnoses:  Atypical chest pain    Rx / DC Orders ED Discharge Orders          Ordered    ibuprofen (ADVIL) 800 MG tablet  Every 8 hours PRN        02/13/23 1852

## 2023-02-13 NOTE — ED Notes (Signed)
Dc instructions and scripts reviewed with pt no questions or concerns at this time. Will look on mychart for COVID results

## 2023-02-13 NOTE — ED Triage Notes (Signed)
Left sided chest pain x 1 week.  Stabbing in nature.  Present with movement of left arm and deep inspiration.  No frank shob or n/v/d.

## 2023-11-01 ENCOUNTER — Emergency Department (HOSPITAL_COMMUNITY)
Admission: EM | Admit: 2023-11-01 | Discharge: 2023-11-01 | Disposition: A | Discharge status: Home / Self Care | Payer: Self-pay | Attending: Emergency Medicine | Admitting: Emergency Medicine

## 2023-11-01 ENCOUNTER — Other Ambulatory Visit: Payer: Self-pay

## 2023-11-01 ENCOUNTER — Emergency Department (HOSPITAL_COMMUNITY): Payer: Self-pay

## 2023-11-01 ENCOUNTER — Encounter (HOSPITAL_COMMUNITY): Payer: Self-pay

## 2023-11-01 DIAGNOSIS — M436 Torticollis: Secondary | ICD-10-CM | POA: Insufficient documentation

## 2023-11-01 MED ORDER — NAPROXEN 375 MG PO TABS: 375.0000 mg | ORAL_TABLET | Freq: Two times a day (BID) | ORAL | 0 refills | Status: AC

## 2023-11-01 MED ORDER — PREDNISONE 50 MG PO TABS: 50.0000 mg | ORAL_TABLET | Freq: Every day | ORAL | 0 refills | Status: AC

## 2023-11-01 MED ORDER — METHOCARBAMOL 500 MG PO TABS: 500.0000 mg | ORAL_TABLET | Freq: Two times a day (BID) | ORAL | 0 refills | Status: AC

## 2023-11-01 NOTE — Discharge Instructions (Addendum)
Make sure to take the medications on a full stomach to avoid any gastrointestinal upset.  Follow-up with a primary care doctor or spine doctor to be rechecked if the symptoms do not resolve.  Your discharge instructions include the name of an orthopedic surgeon as well as a neurosurgery office in town.  Call them as needed if your symptoms do not resolve in the next week

## 2023-11-01 NOTE — ED Triage Notes (Signed)
Pt arrived reporting neck pain since he woke up Friday, right side. Feels like he slept bad.Reports taking medication with minimal relief. Slight R shoulder discomfort. No injury. Denies any other pain
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
